# Patient Record
Sex: Male | Born: 1995 | Race: Black or African American | Hispanic: No | Smoking: Current some day smoker
Health system: Southern US, Community
[De-identification: ages and names within clinical notes are randomized; demographics above are authoritative.]

## PROBLEM LIST (undated history)

## (undated) DIAGNOSIS — J45909 Unspecified asthma, uncomplicated: Secondary | ICD-10-CM

---
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
